# Patient Record
Sex: Male | Born: 1990 | Race: Black or African American | Hispanic: No | Marital: Single | Smoking: Never smoker
Health system: Southern US, Community
[De-identification: ages and names within clinical notes are randomized; demographics above are authoritative.]

## PROBLEM LIST (undated history)

## (undated) DIAGNOSIS — S5290XA Unspecified fracture of unspecified forearm, initial encounter for closed fracture: Secondary | ICD-10-CM

## (undated) DIAGNOSIS — T07XXXA Unspecified multiple injuries, initial encounter: Secondary | ICD-10-CM

## (undated) HISTORY — PX: COSMETIC SURGERY: SHX468

## (undated) HISTORY — PX: INGUINAL HERNIA REPAIR: SHX194

## (undated) SURGERY — OPEN REDUCTION INTERNAL FIXATION (ORIF) DISTAL FEMUR FRACTURE
Anesthesia: Choice | Laterality: Left

---

## 2015-11-25 ENCOUNTER — Emergency Department (HOSPITAL_COMMUNITY)
Admission: EM | Admit: 2015-11-25 | Discharge: 2015-11-25 | Disposition: A | Discharge status: Home / Self Care | Payer: Self-pay | Attending: Emergency Medicine | Admitting: Emergency Medicine

## 2015-11-25 ENCOUNTER — Encounter (HOSPITAL_COMMUNITY): Payer: Self-pay | Admitting: Emergency Medicine

## 2015-11-25 DIAGNOSIS — K047 Periapical abscess without sinus: Secondary | ICD-10-CM | POA: Insufficient documentation

## 2015-11-25 DIAGNOSIS — K0381 Cracked tooth: Secondary | ICD-10-CM | POA: Insufficient documentation

## 2015-11-25 DIAGNOSIS — K029 Dental caries, unspecified: Secondary | ICD-10-CM | POA: Insufficient documentation

## 2015-11-25 MED ORDER — NAPROXEN 250 MG PO TABS
500.0000 mg | ORAL_TABLET | Freq: Once | ORAL | Status: AC
  Administered 2015-11-25: 500 mg via ORAL
  Filled 2015-11-25: qty 2

## 2015-11-25 MED ORDER — NAPROXEN 500 MG PO TABS: 500.0000 mg | ORAL_TABLET | Freq: Two times a day (BID) | ORAL | Status: DC

## 2015-11-25 MED ORDER — PENICILLIN V POTASSIUM 500 MG PO TABS: 500.0000 mg | ORAL_TABLET | Freq: Four times a day (QID) | ORAL | Status: DC

## 2015-11-25 NOTE — ED Provider Notes (Signed)
CSN: [id number]     Arrival date & time 11/25/15  1443 History   First MD Initiated Contact with Patient 11/25/15 1507     Chief Complaint  Patient presents with  . Dental Pain     (Consider location/radiation/quality/duration/timing/severity/associated sxs/prior Treatment) Patient is a 24 y.o. male presenting with tooth pain. The history is provided by the patient and medical records.  Dental Pain    24 year old male here with left lower dental pain for the past 3 days. He states initially with a mild toothache, yesterday he began having swelling of his left cheek. He states is painful to chew on the affected side. No difficulty swallowing or drooling. Denies fever or chills. Patient is a Consulting civil engineerstudent at [employer], he does not currently have a Education officer, communitydentist. No intervention tried prior to arrival.  History reviewed. No pertinent past medical history. History reviewed. No pertinent past surgical history. History reviewed. No pertinent family history. Social History  Substance Use Topics  . Smoking status: Never Smoker   . Smokeless tobacco: None  . Alcohol Use: Yes     Comment: occ    Review of Systems  HENT: Positive for dental problem.   All other systems reviewed and are negative.     Allergies  Review of patient's allergies indicates no known allergies.  Home Medications   Prior to Admission medications   Not on File   BP 112/80 mmHg  Pulse 98  Temp(Src) 98.3 F (36.8 C) (Oral)  Resp 18  SpO2 99%   Physical Exam  Constitutional: He is oriented to person, place, and time. He appears well-developed and well-nourished. No distress.  HENT:  Head: Normocephalic and atraumatic.  Mouth/Throat: Oropharynx is clear and moist.  Teeth largely in very poor dentition, left lower molars broken with dental caries noted, left lower premolar appears infected, surrounding gingiva swollen with abscess noted, no fluctuance or drainage, handling secretions appropriately, no trismus;  mild swelling of left lower cheek without extension into neck'; normal phonation without stridor  Eyes: Conjunctivae and EOM are normal. Pupils are equal, round, and reactive to light.  Neck: Normal range of motion. Neck supple.  Cardiovascular: Normal rate, regular rhythm and normal heart sounds.   Pulmonary/Chest: Effort normal and breath sounds normal. No respiratory distress. He has no wheezes.  Abdominal: Soft. Bowel sounds are normal. There is no tenderness. There is no guarding.  Musculoskeletal: Normal range of motion.  Neurological: He is alert and oriented to person, place, and time.  Skin: Skin is warm. He is not diaphoretic.  Psychiatric: He has a normal mood and affect.  Nursing note and vitals reviewed.   ED Course  Procedures (including critical care time) Labs Review Labs Reviewed - No data to display  Imaging Review No results found. I have personally reviewed and evaluated these images and lab results as part of my medical decision-making.   EKG Interpretation None      MDM   Final diagnoses:  Dental abscess   24 y.o. M here with deental pain.  Signs of abscess noted on exam. No drainable fluid collection at this time.  Mild swelling of left lower cheek without extension into neck. No clinical concern for Ludwig's angina at this time. Patient will be started on antibiotics. He will be given dental follow-up.  Discussed plan with patient, he/she acknowledged understanding and agreed with plan of care.  Return precautions given for new or worsening symptoms.   Garlon HatchetLisa M Huxley Shurley, PA-C 11/25/15 1615  Geoffery Lyonsouglas Delo,  MD 11/25/15 [phone]

## 2015-11-25 NOTE — ED Notes (Signed)
Pt sts left sided dental pain from bad tooth per pt

## 2015-11-25 NOTE — Discharge Instructions (Signed)
Take the prescribed medication as directed. Follow-up with dentist on call--- call their office Monday to make appt. Return to the ED for new or worsening symptoms.  Dental Abscess A dental abscess is a collection of pus in or around a tooth. CAUSES This condition is caused by a bacterial infection around the root of the tooth that involves the inner part of the tooth (pulp). It may result from:  Severe tooth decay.  Trauma to the tooth that allows bacteria to enter into the pulp, such as a broken or chipped tooth.  Severe gum disease around a tooth. SYMPTOMS Symptoms of this condition include:  Severe pain in and around the infected tooth.  Swelling and redness around the infected tooth, in the mouth, or in the face.  Tenderness.  Pus drainage.  Bad breath.  Bitter taste in the mouth.  Difficulty swallowing.  Difficulty opening the mouth.  Nausea.  Vomiting.  Chills.  Swollen neck glands.  Fever. DIAGNOSIS This condition is diagnosed with examination of the infected tooth. During the exam, your dentist may tap on the infected tooth. Your dentist will also ask about your medical and dental history and may order X-rays. TREATMENT This condition is treated by eliminating the infection. This may be done with:  Antibiotic medicine.  A root canal. This may be performed to save the tooth.  Pulling (extracting) the tooth. This may also involve draining the abscess. This is done if the tooth cannot be saved. HOME CARE INSTRUCTIONS  Take medicines only as directed by your dentist.  If you were prescribed antibiotic medicine, finish all of it even if you start to feel better.  Rinse your mouth (gargle) often with salt water to relieve pain or swelling.  Do not drive or operate heavy machinery while taking pain medicine.  Do not apply heat to the outside of your mouth.  Keep all follow-up visits as directed by your dentist. This is important. SEEK MEDICAL CARE  IF:  Your pain is worse and is not helped by medicine. SEEK IMMEDIATE MEDICAL CARE IF:  You have a fever or chills.  Your symptoms suddenly get worse.  You have a very bad headache.  You have problems breathing or swallowing.  You have trouble opening your mouth.  You have swelling in your neck or around your eye.   This information is not intended to replace advice given to you by your health care provider. Make sure you discuss any questions you have with your health care provider.   Document Released: 12/09/2005 Document Revised: 04/25/2015 Document Reviewed: 12/06/2014 Elsevier Interactive Patient Education [employer]2016 Elsevier Inc.

## 2015-11-25 NOTE — ED Notes (Signed)
Declined W/C at D/C and was escorted to lobby by RN. 

## 2015-11-25 NOTE — ED Notes (Signed)
Left facial pain and swelling since last pm.

## 2016-02-21 DIAGNOSIS — S5290XA Unspecified fracture of unspecified forearm, initial encounter for closed fracture: Secondary | ICD-10-CM

## 2016-02-21 HISTORY — DX: Unspecified fracture of unspecified forearm, initial encounter for closed fracture: S52.90XA

## 2016-03-06 ENCOUNTER — Ambulatory Visit (HOSPITAL_COMMUNITY)
Admission: RE | Admit: 2016-03-06 | Discharge: 2016-03-06 | Disposition: A | Discharge status: Home / Self Care | Payer: BLUE CROSS/BLUE SHIELD | Source: Ambulatory Visit | Attending: Orthopedic Surgery | Admitting: Orthopedic Surgery

## 2016-03-06 ENCOUNTER — Other Ambulatory Visit: Payer: Self-pay | Admitting: Orthopedic Surgery

## 2016-03-06 ENCOUNTER — Encounter (HOSPITAL_COMMUNITY): Payer: Self-pay | Admitting: *Deleted

## 2016-03-06 ENCOUNTER — Encounter (HOSPITAL_COMMUNITY)
Admission: RE | Disposition: A | Discharge status: Home / Self Care | Payer: Self-pay | Source: Ambulatory Visit | Attending: Orthopedic Surgery

## 2016-03-06 DIAGNOSIS — Z538 Procedure and treatment not carried out for other reasons: Secondary | ICD-10-CM | POA: Insufficient documentation

## 2016-03-06 DIAGNOSIS — S52502A Unspecified fracture of the lower end of left radius, initial encounter for closed fracture: Secondary | ICD-10-CM | POA: Diagnosis present

## 2016-03-06 DIAGNOSIS — X58XXXA Exposure to other specified factors, initial encounter: Secondary | ICD-10-CM | POA: Insufficient documentation

## 2016-03-06 LAB — CBC
HEMATOCRIT: 42 % (ref 39.0–52.0)
HEMOGLOBIN: 13.8 g/dL (ref 13.0–17.0)
MCH: 30.1 pg (ref 26.0–34.0)
MCHC: 32.9 g/dL (ref 30.0–36.0)
MCV: 91.5 fL (ref 78.0–100.0)
Platelets: 228 10*3/uL (ref 150–400)
RBC: 4.59 MIL/uL (ref 4.22–5.81)
RDW: 13.2 % (ref 11.5–15.5)
WBC: 4.8 10*3/uL (ref 4.0–10.5)

## 2016-03-06 SURGERY — OPEN REDUCTION INTERNAL FIXATION (ORIF) DISTAL RADIUS FRACTURE
Anesthesia: Choice | Laterality: Left

## 2016-03-06 MED ORDER — CHLORHEXIDINE GLUCONATE 4 % EX LIQD: 60.0000 mL | Freq: Once | CUTANEOUS | Status: DC

## 2016-03-06 MED ORDER — CEFAZOLIN SODIUM-DEXTROSE 2-3 GM-% IV SOLR
INTRAVENOUS | Status: AC
  Filled 2016-03-06: qty 50

## 2016-03-06 MED ORDER — CEFAZOLIN SODIUM-DEXTROSE 2-3 GM-% IV SOLR: 2.0000 g | INTRAVENOUS | Status: DC

## 2016-03-06 NOTE — Progress Notes (Addendum)
Received call from Dr. Luiz BlareGraves stating surgery for this patient is to be postponed, d/t late OR time.   I have explained this to patient, who seems adamant about staying.  I explained to patient, that  Dr. Luiz BlareGraves' office will call him again for future instructions.  Have called his ride to come and get him

## 2016-03-06 NOTE — H&P (Signed)
  PREOPERATIVE H&P  Chief Complaint: l distal radius fracture with ulna dislocatuion  HPI: Daniel Livingston is a 24 y.o. male who presents for evaluation of l distal radius fracture with ulnar dislocation. It has been present for 4 days and has been worsening. He has failed conservative measures. Pain is rated as moderate.  No past medical history on file. No past surgical history on file. Social History   Social History  . Marital Status: Single    Spouse Name: N/A  . Number of Children: N/A  . Years of Education: N/A   Social History Main Topics  . Smoking status: Never Smoker   . Smokeless tobacco: Not on file  . Alcohol Use: Yes     Comment: occ  . Drug Use: Yes    Special: Marijuana  . Sexual Activity: Not on file   Other Topics Concern  . Not on file   Social History Narrative  . No narrative on file   No family history on file. No Known Allergies Prior to Admission medications   Medication Sig Start Date End Date Taking? Authorizing Provider  naproxen (NAPROSYN) 500 MG tablet Take 1 tablet (500 mg total) by mouth 2 (two) times daily with a meal. 11/25/15   Lisa M Sanders, PA-C  penicillin v potassium (VEETID) 500 MG tablet Take 1 tablet (500 mg total) by mouth 4 (four) times daily. 11/25/15   Lisa M Sanders, PA-C     Positive ROS: none  All other systems have been reviewed and were otherwise negative with the exception of those mentioned in the HPI and as above.  Physical Exam: There were no vitals filed for this visit.  General: Alert, no acute distress Cardiovascular: No pedal edema Respiratory: No cyanosis, no use of accessory musculature GI: No organomegaly, abdomen is soft and non-tender Skin: No lesions in the area of chief complaint Neurologic: Sensation intact distally Psychiatric: Patient is competent for consent with normal mood and affect Lymphatic: No axillary or cervical lymphadenopathy  MUSCULOSKELETAL: l forearm-sig sts and pain on rom XRAY  reduced ulnar dislocation and short oblique distal radius fracture  Assessment/Plan: Left Forearm Fracture Plan for Procedure(s): OPEN REDUCTION INTERNAL FIXATION (ORIF) DISTAL RADIAL FRACTURE and evaluation of DRUJ  The risks benefits and alternatives were discussed with the patient including but not limited to the risks of nonoperative treatment, versus surgical intervention including infection, bleeding, nerve injury, malunion, nonunion, hardware prominence, hardware failure, need for hardware removal, blood clots, cardiopulmonary complications, morbidity, mortality, among others, and they were willing to proceed.  Predicted outcome is good, although there will be at least a six to nine month expected recovery.  Aron Needles L, MD 03/06/2016 9:36 AM 

## 2016-03-07 ENCOUNTER — Other Ambulatory Visit: Payer: Self-pay | Admitting: Orthopedic Surgery

## 2016-03-07 ENCOUNTER — Encounter (HOSPITAL_BASED_OUTPATIENT_CLINIC_OR_DEPARTMENT_OTHER): Payer: Self-pay | Admitting: *Deleted

## 2016-03-07 DIAGNOSIS — T07XXXA Unspecified multiple injuries, initial encounter: Secondary | ICD-10-CM

## 2016-03-07 HISTORY — DX: Unspecified multiple injuries, initial encounter: T07.XXXA

## 2016-03-08 ENCOUNTER — Ambulatory Visit (HOSPITAL_BASED_OUTPATIENT_CLINIC_OR_DEPARTMENT_OTHER): Payer: BLUE CROSS/BLUE SHIELD | Admitting: Anesthesiology

## 2016-03-08 ENCOUNTER — Ambulatory Visit (HOSPITAL_BASED_OUTPATIENT_CLINIC_OR_DEPARTMENT_OTHER)
Admission: RE | Admit: 2016-03-08 | Discharge: 2016-03-08 | Disposition: A | Discharge status: Home / Self Care | Payer: BLUE CROSS/BLUE SHIELD | Source: Ambulatory Visit | Attending: Orthopedic Surgery | Admitting: Orthopedic Surgery

## 2016-03-08 ENCOUNTER — Encounter (HOSPITAL_BASED_OUTPATIENT_CLINIC_OR_DEPARTMENT_OTHER)
Admission: RE | Disposition: A | Discharge status: Home / Self Care | Payer: Self-pay | Source: Ambulatory Visit | Attending: Orthopedic Surgery

## 2016-03-08 ENCOUNTER — Ambulatory Visit (HOSPITAL_COMMUNITY): Payer: BLUE CROSS/BLUE SHIELD

## 2016-03-08 ENCOUNTER — Encounter (HOSPITAL_BASED_OUTPATIENT_CLINIC_OR_DEPARTMENT_OTHER): Payer: Self-pay | Admitting: Anesthesiology

## 2016-03-08 DIAGNOSIS — S52372A Galeazzi's fracture of left radius, initial encounter for closed fracture: Secondary | ICD-10-CM | POA: Insufficient documentation

## 2016-03-08 DIAGNOSIS — T148XXA Other injury of unspecified body region, initial encounter: Secondary | ICD-10-CM

## 2016-03-08 DIAGNOSIS — X58XXXA Exposure to other specified factors, initial encounter: Secondary | ICD-10-CM | POA: Diagnosis not present

## 2016-03-08 DIAGNOSIS — Z791 Long term (current) use of non-steroidal anti-inflammatories (NSAID): Secondary | ICD-10-CM | POA: Diagnosis not present

## 2016-03-08 HISTORY — DX: Unspecified fracture of unspecified forearm, initial encounter for closed fracture: S52.90XA

## 2016-03-08 HISTORY — PX: OPEN REDUCTION INTERNAL FIXATION (ORIF) DISTAL RADIAL FRACTURE: SHX5989

## 2016-03-08 HISTORY — DX: Unspecified multiple injuries, initial encounter: T07.XXXA

## 2016-03-08 SURGERY — OPEN REDUCTION INTERNAL FIXATION (ORIF) DISTAL RADIUS FRACTURE
Anesthesia: General | Site: Arm Lower | Laterality: Left

## 2016-03-08 MED ORDER — LIDOCAINE HCL (CARDIAC) 20 MG/ML IV SOLN
INTRAVENOUS | Status: AC
Start: 2016-03-08 — End: 2016-03-08
  Filled 2016-03-08: qty 5

## 2016-03-08 MED ORDER — MEPERIDINE HCL 25 MG/ML IJ SOLN: 6.2500 mg | INTRAMUSCULAR | Status: DC | PRN

## 2016-03-08 MED ORDER — FENTANYL CITRATE (PF) 100 MCG/2ML IJ SOLN
INTRAMUSCULAR | Status: AC
  Filled 2016-03-08: qty 2

## 2016-03-08 MED ORDER — LIDOCAINE HCL (CARDIAC) 20 MG/ML IV SOLN
INTRAVENOUS | Status: DC | PRN
  Administered 2016-03-08: 100 mg via INTRAVENOUS

## 2016-03-08 MED ORDER — SCOPOLAMINE 1 MG/3DAYS TD PT72: 1.0000 | MEDICATED_PATCH | Freq: Once | TRANSDERMAL | Status: DC | PRN

## 2016-03-08 MED ORDER — LACTATED RINGERS IV SOLN
INTRAVENOUS | Status: DC
  Administered 2016-03-08 (×2): via INTRAVENOUS

## 2016-03-08 MED ORDER — ONDANSETRON HCL 4 MG/2ML IJ SOLN
INTRAMUSCULAR | Status: AC
  Filled 2016-03-08: qty 2

## 2016-03-08 MED ORDER — CEFAZOLIN SODIUM-DEXTROSE 2-3 GM-% IV SOLR
INTRAVENOUS | Status: AC
  Filled 2016-03-08: qty 50

## 2016-03-08 MED ORDER — FENTANYL CITRATE (PF) 100 MCG/2ML IJ SOLN
50.0000 ug | INTRAMUSCULAR | Status: DC | PRN
  Administered 2016-03-08: 100 ug via INTRAVENOUS

## 2016-03-08 MED ORDER — GLYCOPYRROLATE 0.2 MG/ML IJ SOLN: 0.2000 mg | Freq: Once | INTRAMUSCULAR | Status: DC | PRN

## 2016-03-08 MED ORDER — OXYCODONE-ACETAMINOPHEN 5-325 MG PO TABS: 1.0000 | ORAL_TABLET | ORAL | Status: AC | PRN

## 2016-03-08 MED ORDER — PROPOFOL 10 MG/ML IV BOLUS
INTRAVENOUS | Status: AC
  Filled 2016-03-08: qty 20

## 2016-03-08 MED ORDER — MIDAZOLAM HCL 2 MG/2ML IJ SOLN
1.0000 mg | INTRAMUSCULAR | Status: DC | PRN
  Administered 2016-03-08: 2 mg via INTRAVENOUS

## 2016-03-08 MED ORDER — LACTATED RINGERS IV SOLN: INTRAVENOUS | Status: DC

## 2016-03-08 MED ORDER — CEFAZOLIN SODIUM-DEXTROSE 2-3 GM-% IV SOLR
2.0000 g | INTRAVENOUS | Status: AC
  Administered 2016-03-08: 2 g via INTRAVENOUS

## 2016-03-08 MED ORDER — OXYCODONE HCL 5 MG/5ML PO SOLN: 5.0000 mg | Freq: Once | ORAL | Status: DC | PRN

## 2016-03-08 MED ORDER — MIDAZOLAM HCL 2 MG/2ML IJ SOLN
INTRAMUSCULAR | Status: AC
  Filled 2016-03-08: qty 2

## 2016-03-08 MED ORDER — DEXAMETHASONE SODIUM PHOSPHATE 4 MG/ML IJ SOLN
INTRAMUSCULAR | Status: DC | PRN
  Administered 2016-03-08: 10 mg via INTRAVENOUS

## 2016-03-08 MED ORDER — BUPIVACAINE-EPINEPHRINE (PF) 0.5% -1:200000 IJ SOLN
INTRAMUSCULAR | Status: DC | PRN
  Administered 2016-03-08: 25 mL via PERINEURAL

## 2016-03-08 MED ORDER — PROPOFOL 10 MG/ML IV BOLUS
INTRAVENOUS | Status: DC | PRN
  Administered 2016-03-08: 200 mg via INTRAVENOUS

## 2016-03-08 MED ORDER — HYDROMORPHONE HCL 1 MG/ML IJ SOLN: 0.2500 mg | INTRAMUSCULAR | Status: DC | PRN

## 2016-03-08 MED ORDER — OXYCODONE HCL 5 MG PO TABS: 5.0000 mg | ORAL_TABLET | Freq: Once | ORAL | Status: DC | PRN

## 2016-03-08 MED ORDER — ONDANSETRON HCL 4 MG/2ML IJ SOLN
INTRAMUSCULAR | Status: DC | PRN
  Administered 2016-03-08: 4 mg via INTRAVENOUS

## 2016-03-08 MED ORDER — DEXAMETHASONE SODIUM PHOSPHATE 10 MG/ML IJ SOLN
INTRAMUSCULAR | Status: AC
  Filled 2016-03-08: qty 1

## 2016-03-08 SURGICAL SUPPLY — 66 items
BANDAGE COBAN STERILE 2 (GAUZE/BANDAGES/DRESSINGS) IMPLANT
BIT DRILL 2.5X2.75 QC CALB (BIT) ×3 IMPLANT
BIT DRILL CALIBRATED 2.7 (BIT) ×2 IMPLANT
BIT DRILL CALIBRATED 2.7MM (BIT) ×1
BLADE MINI RND TIP GREEN BEAV (BLADE) IMPLANT
BLADE SURG 15 STRL LF DISP TIS (BLADE) ×1 IMPLANT
BLADE SURG 15 STRL SS (BLADE) ×2
BNDG COHESIVE 4X5 TAN STRL (GAUZE/BANDAGES/DRESSINGS) ×3 IMPLANT
BNDG ESMARK 4X9 LF (GAUZE/BANDAGES/DRESSINGS) ×3 IMPLANT
BNDG GAUZE ELAST 4 BULKY (GAUZE/BANDAGES/DRESSINGS) ×6 IMPLANT
BRUSH SCRUB EZ PLAIN DRY (MISCELLANEOUS) ×3 IMPLANT
CANISTER SUCT 1200ML W/VALVE (MISCELLANEOUS) ×3 IMPLANT
CHLORAPREP W/TINT 26ML (MISCELLANEOUS) ×3 IMPLANT
CORDS BIPOLAR (ELECTRODE) ×3 IMPLANT
COVER BACK TABLE 60X90IN (DRAPES) ×3 IMPLANT
COVER MAYO STAND STRL (DRAPES) ×3 IMPLANT
CUFF TOURNIQUET SINGLE 18IN (TOURNIQUET CUFF) ×3 IMPLANT
CUFF TOURNIQUET SINGLE 24IN (TOURNIQUET CUFF) IMPLANT
DRAPE C-ARM 42X72 X-RAY (DRAPES) ×3 IMPLANT
DRAPE EXTREMITY T 121X128X90 (DRAPE) ×3 IMPLANT
DRAPE SURG 17X23 STRL (DRAPES) ×3 IMPLANT
DRSG ADAPTIC 3X8 NADH LF (GAUZE/BANDAGES/DRESSINGS) ×3 IMPLANT
DRSG EMULSION OIL 3X3 NADH (GAUZE/BANDAGES/DRESSINGS) IMPLANT
ELECT REM PT RETURN 9FT ADLT (ELECTROSURGICAL) ×3
ELECTRODE REM PT RTRN 9FT ADLT (ELECTROSURGICAL) ×1 IMPLANT
GAUZE SPONGE 4X4 12PLY STRL (GAUZE/BANDAGES/DRESSINGS) ×3 IMPLANT
GLOVE BIO SURGEON STRL SZ7.5 (GLOVE) ×3 IMPLANT
GLOVE BIOGEL M STRL SZ7.5 (GLOVE) ×3 IMPLANT
GLOVE BIOGEL PI IND STRL 7.0 (GLOVE) ×2 IMPLANT
GLOVE BIOGEL PI IND STRL 8 (GLOVE) ×1 IMPLANT
GLOVE BIOGEL PI INDICATOR 7.0 (GLOVE) ×4
GLOVE BIOGEL PI INDICATOR 8 (GLOVE) ×2
GLOVE ECLIPSE 6.5 STRL STRAW (GLOVE) ×9 IMPLANT
GOWN STRL REUS W/TWL XL LVL3 (GOWN DISPOSABLE) ×9 IMPLANT
NEEDLE 16GX1 1/2 (NEEDLE) IMPLANT
NEEDLE HYPO 25X1 1.5 SAFETY (NEEDLE) IMPLANT
NS IRRIG 1000ML POUR BTL (IV SOLUTION) ×3 IMPLANT
PACK BASIN DAY SURGERY FS (CUSTOM PROCEDURE TRAY) ×3 IMPLANT
PADDING CAST ABS 4INX4YD NS (CAST SUPPLIES)
PADDING CAST ABS COTTON 4X4 ST (CAST SUPPLIES) IMPLANT
PENCIL BUTTON HOLSTER BLD 10FT (ELECTRODE) ×3 IMPLANT
PLATE LOCK COMP 7H FOOT (Plate) ×3 IMPLANT
RUBBERBAND STERILE (MISCELLANEOUS) IMPLANT
SCREW CORTICAL 3.5MM 14MM (Screw) ×9 IMPLANT
SCREW LOCK CORT STAR 3.5X10 (Screw) ×6 IMPLANT
SCREW LOCK CORT STAR 3.5X12 (Screw) ×6 IMPLANT
SLEEVE SCD COMPRESS KNEE MED (MISCELLANEOUS) ×3 IMPLANT
SLING ARM FOAM STRAP LRG (SOFTGOODS) ×3 IMPLANT
SPLINT FAST PLASTER 5X30 (CAST SUPPLIES) ×2
SPLINT PLASTER CAST FAST 5X30 (CAST SUPPLIES) ×1 IMPLANT
STOCKINETTE 6  STRL (DRAPES) ×2
STOCKINETTE 6 STRL (DRAPES) ×1 IMPLANT
SUCTION FRAZIER HANDLE 10FR (MISCELLANEOUS) ×2
SUCTION TUBE FRAZIER 10FR DISP (MISCELLANEOUS) ×1 IMPLANT
SUT VIC AB 2-0 CT3 27 (SUTURE) ×3 IMPLANT
SUT VIC AB 3-0 PS1 18 (SUTURE) ×2
SUT VIC AB 3-0 PS1 18XBRD (SUTURE) ×1 IMPLANT
SUT VICRYL 4-0 PS2 18IN ABS (SUTURE) IMPLANT
SUT VICRYL RAPIDE 4-0 (SUTURE) IMPLANT
SUT VICRYL RAPIDE 4/0 PS 2 (SUTURE) ×6 IMPLANT
SYR BULB 3OZ (MISCELLANEOUS) ×3 IMPLANT
SYRINGE 10CC LL (SYRINGE) ×3 IMPLANT
TOWEL OR 17X24 6PK STRL BLUE (TOWEL DISPOSABLE) ×3 IMPLANT
TOWEL OR NON WOVEN STRL DISP B (DISPOSABLE) ×3 IMPLANT
TUBING SUCTION 1/4X6FT (MISCELLANEOUS) ×3 IMPLANT
UNDERPAD 30X30 (UNDERPADS AND DIAPERS) ×3 IMPLANT

## 2016-03-08 NOTE — Interval H&P Note (Signed)
History and Physical Interval Note:  03/08/2016 1:22 PM  Daniel Livingston  has presented today for surgery, with the diagnosis of DISPLACED LEFT RADIUS FRACTURE with dislocation at DRUJ S52.372A  The various methods of treatment have been discussed with the patient and family. After consideration of risks, benefits and other options for treatment, the patient has consented to  Procedure(s): OPEN TREATMENT OF LEFT RADIUS FRACTURE (Left) as a surgical intervention .  The patient's history has been reviewed, patient examined, no change in status, stable for surgery.  I have reviewed the patient's chart and labs.  Questions were answered to the patient's satisfaction.     Eutimio Gharibian A.

## 2016-03-08 NOTE — Anesthesia Procedure Notes (Addendum)
Anesthesia Regional Block:  Infraclavicular brachial plexus block  Pre-Anesthetic Checklist: ,, timeout performed, Correct Patient, Correct Site, Correct Laterality, Correct Procedure, Correct Position, site marked, Risks and benefits discussed,  Surgical consent,  Pre-op evaluation,  At surgeon's request and post-op pain management  Laterality: Left and Upper  Prep: chloraprep       Needles:  Injection technique: Single-shot  Needle Type: Echogenic Stimulator Needle     Needle Length: 5cm 5 cm Needle Gauge: 21 and 21 G    Additional Needles:  Procedures: ultrasound guided (picture in chart) Infraclavicular brachial plexus block Narrative:  Start time: 03/08/2016 1:28 PM End time: 03/08/2016 1:34 PM Injection made incrementally with aspirations every 5 mL.  Performed by: Personally  Anesthesiologist: CREWS, DAVID   Procedure Name: LMA Insertion Date/Time: 03/08/2016 1:44 PM Performed by: York Livingston, Daniel Olberding W Pre-anesthesia Checklist: Patient identified, Emergency Drugs available, Suction available and Patient being monitored Patient Re-evaluated:Patient Re-evaluated prior to inductionOxygen Delivery Method: Circle System Utilized Preoxygenation: Pre-oxygenation with 100% oxygen Intubation Type: IV induction Ventilation: Mask ventilation without difficulty LMA: LMA inserted LMA Size: 4.0 Number of attempts: 1 Airway Equipment and Method: Bite block Placement Confirmation: positive ETCO2 and breath sounds checked- equal and bilateral Tube secured with: Tape Dental Injury: Teeth and Oropharynx as per pre-operative assessment       Left Infraclavicular block image

## 2016-03-08 NOTE — Transfer of Care (Signed)
Immediate Anesthesia Transfer of Care Note  Patient: Daniel Livingston  Procedure(s) Performed: Procedure(s): OPEN TREATMENT OF LEFT RADIUS FRACTURE (Left)  Patient Location: PACU  Anesthesia Type:General and GA combined with regional for post-op pain  Level of Consciousness: awake and alert   Airway & Oxygen Therapy: Patient Spontanous Breathing and Patient connected to face mask oxygen  Post-op Assessment: Report given to RN and Post -op Vital signs reviewed and stable  Post vital signs: Reviewed and stable  Last Vitals:  Filed Vitals:   03/08/16 1330 03/08/16 1331  BP: 128/86   Pulse: 70 62  Temp:    Resp: 12 13    Complications: No apparent anesthesia complications

## 2016-03-08 NOTE — Discharge Instructions (Signed)
Discharge Instructions   You have a dressing with a plaster splint incorporated in it. Move your fingers as much as possible, making a full fist and fully opening the fist. Elevate your hand to reduce pain & swelling of the digits.  Ice over the operative site may be helpful to reduce pain & swelling.  DO NOT USE HEAT. Pain medicine has been prescribed for you.  Use your medicine as needed over the first 48 hours, and then you can begin to taper your use.  You may use Tylenol in place of your prescribed pain medication, but not IN ADDITION to it. Leave the dressing in place until you return to our office.  You may shower, but keep the bandage clean & dry.  You may drive a car when you are off of prescription pain medications and can safely control your vehicle with both hands. Call our office to make an appointment for 10-to 15 days from the date of surgery.   Please call [phone] during normal business hours or [phone] after hours for any problems. Including the following:  - excessive redness of the incisions - drainage for more than 4 days - fever of more than 101.5 F  *Please note that pain medications will not be refilled after hours or on weekends.  Post Anesthesia Home Care Instructions  Activity: Get plenty of rest for the remainder of the day. A responsible adult should stay with you for 24 hours following the procedure.  For the next 24 hours, DO NOT: -Drive a car -Advertising copywriterperate machinery -Drink alcoholic beverages -Take any medication unless instructed by your physician -Make any legal decisions or sign important papers.  Meals: Start with liquid foods such as gelatin or soup. Progress to regular foods as tolerated. Avoid greasy, spicy, heavy foods. If nausea and/or vomiting occur, drink only clear liquids until the nausea and/or vomiting subsides. Call your physician if vomiting continues.  Special Instructions/Symptoms: Your throat may feel dry or sore from the  anesthesia or the breathing tube placed in your throat during surgery. If this causes discomfort, gargle with warm salt water. The discomfort should disappear within 24 hours.  If you had a scopolamine patch placed behind your ear for the management of post- operative nausea and/or vomiting:  1. The medication in the patch is effective for 72 hours, after which it should be removed.  Wrap patch in a tissue and discard in the trash. Wash hands thoroughly with soap and water. 2. You may remove the patch earlier than 72 hours if you experience unpleasant side effects which may include dry mouth, dizziness or visual disturbances. 3. Avoid touching the patch. Wash your hands with soap and water after contact with the patch.   Regional Anesthesia Blocks  1. Numbness or the inability to move the "blocked" extremity may last from 3-48 hours after placement. The length of time depends on the medication injected and your individual response to the medication. If the numbness is not going away after 48 hours, call your surgeon.  2. The extremity that is blocked will need to be protected until the numbness is gone and the  Strength has returned. Because you cannot feel it, you will need to take extra care to avoid injury. Because it may be weak, you may have difficulty moving it or using it. You may not know what position it is in without looking at it while the block is in effect.  3. For blocks in the legs and feet, returning to  weight bearing and walking needs to be done carefully. You will need to wait until the numbness is entirely gone and the strength has returned. You should be able to move your leg and foot normally before you try and bear weight or walk. You will need someone to be with you when you first try to ensure you do not fall and possibly risk injury.  4. Bruising and tenderness at the needle site are common side effects and will resolve in a few days.  5. Persistent numbness or new problems  with movement should be communicated to the surgeon or the [facility] [phone] [facility] [phone]).

## 2016-03-08 NOTE — Anesthesia Postprocedure Evaluation (Signed)
Anesthesia Post Note  Patient: Daniel Livingston  Procedure(s) Performed: Procedure(s) (LRB): OPEN TREATMENT OF LEFT RADIUS FRACTURE (Left)  Patient location during evaluation: PACU Anesthesia Type: General Level of consciousness: awake and alert Pain management: pain level controlled Vital Signs Assessment: post-procedure vital signs reviewed and stable Respiratory status: spontaneous breathing, nonlabored ventilation and respiratory function stable Cardiovascular status: blood pressure returned to baseline and stable Postop Assessment: no signs of nausea or vomiting Anesthetic complications: no    Last Vitals:  Filed Vitals:   03/08/16 1515 03/08/16 1530  BP: 133/75 130/87  Pulse: 78 78  Temp:    Resp: 12 10    Last Pain:  Filed Vitals:   03/08/16 1534  PainSc: 0-No pain                 Analisa Sledd A

## 2016-03-08 NOTE — H&P (View-Only) (Signed)
  PREOPERATIVE H&P  Chief Complaint: l distal radius fracture with ulna dislocatuion  HPI: Daniel Livingston is a 25 y.o. male who presents for evaluation of l distal radius fracture with ulnar dislocation. It has been present for 4 days and has been worsening. He has failed conservative measures. Pain is rated as moderate.  No past medical history on file. No past surgical history on file. Social History   Social History  . Marital Status: Single    Spouse Name: N/A  . Number of Children: N/A  . Years of Education: N/A   Social History Main Topics  . Smoking status: Never Smoker   . Smokeless tobacco: Not on file  . Alcohol Use: Yes     Comment: occ  . Drug Use: Yes    Special: Marijuana  . Sexual Activity: Not on file   Other Topics Concern  . Not on file   Social History Narrative  . No narrative on file   No family history on file. No Known Allergies Prior to Admission medications   Medication Sig Start Date End Date Taking? Authorizing Provider  naproxen (NAPROSYN) 500 MG tablet Take 1 tablet (500 mg total) by mouth 2 (two) times daily with a meal. 11/25/15   Daniel HatchetLisa M Sanders, PA-C  penicillin v potassium (VEETID) 500 MG tablet Take 1 tablet (500 mg total) by mouth 4 (four) times daily. 11/25/15   Daniel HatchetLisa M Sanders, PA-C     Positive ROS: none  All other systems have been reviewed and were otherwise negative with the exception of those mentioned in the HPI and as above.  Physical Exam: There were no vitals filed for this visit.  General: Alert, no acute distress Cardiovascular: No pedal edema Respiratory: No cyanosis, no use of accessory musculature GI: No organomegaly, abdomen is soft and non-tender Skin: No lesions in the area of chief complaint Neurologic: Sensation intact distally Psychiatric: Patient is competent for consent with normal mood and affect Lymphatic: No axillary or cervical lymphadenopathy  MUSCULOSKELETAL: l forearm-sig sts and pain on rom XRAY  reduced ulnar dislocation and short oblique distal radius fracture  Assessment/Plan: Left Forearm Fracture Plan for Procedure(s): OPEN REDUCTION INTERNAL FIXATION (ORIF) DISTAL RADIAL FRACTURE and evaluation of DRUJ  The risks benefits and alternatives were discussed with the patient including but not limited to the risks of nonoperative treatment, versus surgical intervention including infection, bleeding, nerve injury, malunion, nonunion, hardware prominence, hardware failure, need for hardware removal, blood clots, cardiopulmonary complications, morbidity, mortality, among others, and they were willing to proceed.  Predicted outcome is good, although there will be at least a six to nine month expected recovery.  Daniel Livingston L, MD 03/06/2016 9:36 AM

## 2016-03-08 NOTE — Progress Notes (Signed)
Assisted Dr. Crews with left, ultrasound guided, infraclavicular block. Side rails up, monitors on throughout procedure. See vital signs in flow sheet. Tolerated Procedure well. 

## 2016-03-08 NOTE — Op Note (Signed)
03/08/2016  1:23 PM  PATIENT:  Daniel Livingston  25 y.o. male  PRE-OPERATIVE DIAGNOSIS:  Left Galeazzi Fracture of Radius with DRUJ injury  POST-OPERATIVE DIAGNOSIS:  Same  PROCEDURE:  ORIF left Galeazzi fx of radius with closed treatment of DRUJ disruption  SURGEON: Cliffton Astersavid A. Janee Mornhompson, MD  PHYSICIAN ASSISTANT: Danielle RankinKirsten Schrader, OPA-C  ANESTHESIA:  regional and general  SPECIMENS:  None  DRAINS: None  EBL:  less than 50 mL  PREOPERATIVE INDICATIONS:  Daniel FinesShamon Surles is a  25 y.o. male with a h/o left forearm Galeazzi fracture/Dx at DRUJ, provisionally reduced, and originally to undergo operative tx with Dr. Luiz BlareGraves.  Instead, the patient arrived at place of surgery having just eaten.  Surgery was re-scheduled to today, and Dr. Luiz BlareGraves asked me to assume care.  Today, I reviewed with the patient the goals, risks, and options and general operative plan and obtained consent.  The risks benefits and alternatives were discussed with the patient preoperatively including but not limited to the risks of infection, bleeding, nerve injury, cardiopulmonary complications, the need for revision surgery, among others, and the patient verbalized understanding and consented to proceed.  OPERATIVE IMPLANTS: Biomet small fragment 3.5 mm 7-hole plate/screws  OPERATIVE PROCEDURE: After receiving prophylactic antibiotics and a regional block, the patient was escorted to the operative theatre and placed in a supine position.  General anesthesia was administered.  A surgical "time-out" was performed during which the planned procedure, proposed operative site, and the correct patient identity were compared to the operative consent and agreement confirmed by the circulating nurse according to current facility policy. Following application of a tourniquet to the operative extremity, the exposed skin was pre-scrub with Hibiclens scrub brush and then was prepped with Chloraprep and draped in the usual sterile fashion. The  limb was exsanguinated with an Esmarch bandage and the tourniquet inflated to approximately 100mmHg higher than systolic BP.   A sinusoidal-shaped incision was marked and made over the FCR axis at the mid-forearm. The skin was incised sharply with scalpel, subcutaneous tissues with blunt and spreading dissection. The FCR axis was exploited deeply.  Hand raise interval was exploited.  The fracture was cleaned of debris and provisionally reduced.  A 7 hole locking plate was selected.  It was slightly contoured to match the both the radius, and it was placed in its provisional alignment of the radius and this was confirmed fluoroscopically.  It was secured to the radius with a screw through the slotted hole on the distal side of the fracture.  The other 2 distal locking screws were then placed.  The proximal slotted hole was then filled with a compressing nonlocking screw, providing excellent reduction and compression at the fracture site.  The other 2 proximal locking holes were drilled and filled.   Final images of the fracture fixation were obtained and the DRUJ was examined for stability.  It was found to be nicely reduced, and the decision was made to proceed with nonoperative management for the distal radial ulnar joint.  The wound is copiously irrigated, the tourniquet released, and the skin reapproximated with 3-0 Vicryl deep dermal buried sutures and a running 4-0 Vicryl Rapide subcuticular suture followed by benzoin and Steri-Strips.  A long-arm splint dressing was applied with fiberglass splint component, placing the forearm in supination.  He was awakened and taken to recovery in stable condition, breathing spontaneously.  DISPOSITION: The patient will be discharged home today with typical post-op instructions, returning in 10-15 days for reevaluation with new x-rays  of the left forearm out of the splint and conversion to a long-arm cast for an additional 2 weeks.

## 2016-03-08 NOTE — Anesthesia Preprocedure Evaluation (Signed)
Anesthesia Evaluation  Patient identified by MRN, date of birth, ID band Patient awake    Reviewed: Allergy & Precautions, NPO status , Patient's Chart, lab work & pertinent test results  Airway Mallampati: I  TM Distance: >3 FB Neck ROM: Full    Dental  (+) Teeth Intact, Dental Advisory Given   Pulmonary    breath sounds clear to auscultation       Cardiovascular  Rhythm:Regular Rate:Normal     Neuro/Psych    GI/Hepatic   Endo/Other    Renal/GU      Musculoskeletal   Abdominal   Peds  Hematology   Anesthesia Other Findings   Reproductive/Obstetrics                             Anesthesia Physical Anesthesia Plan  ASA: I  Anesthesia Plan: General   Post-op Pain Management: GA combined w/ Regional for post-op pain   Induction: Intravenous  Airway Management Planned: LMA  Additional Equipment:   Intra-op Plan:   Post-operative Plan: Extubation in OR  Informed Consent: I have reviewed the patients History and Physical, chart, labs and discussed the procedure including the risks, benefits and alternatives for the proposed anesthesia with the patient or authorized representative who has indicated his/her understanding and acceptance.   Dental advisory given  Plan Discussed with: CRNA, Anesthesiologist and Surgeon  Anesthesia Plan Comments:         Anesthesia Quick Evaluation  

## 2016-03-09 NOTE — Anesthesia Postprocedure Evaluation (Signed)
Anesthesia Post Note  Patient: Daniel Livingston  Procedure(s) Performed: Procedure(s) (LRB): OPEN TREATMENT OF LEFT RADIUS FRACTURE (Left)  Anesthesia Post Evaluation  Last Vitals:  Filed Vitals:   03/08/16 1600 03/08/16 1610  BP: 137/97 148/96  Pulse: 87 68  Temp:  36.8 C  Resp: 25 18    Last Pain:  Filed Vitals:   03/08/16 1613  PainSc: 0-No pain                 Chlora Mcbain A

## 2016-03-11 ENCOUNTER — Encounter (HOSPITAL_BASED_OUTPATIENT_CLINIC_OR_DEPARTMENT_OTHER): Payer: Self-pay | Admitting: Orthopedic Surgery

## 2016-03-13 ENCOUNTER — Encounter (HOSPITAL_BASED_OUTPATIENT_CLINIC_OR_DEPARTMENT_OTHER): Payer: Self-pay | Admitting: Orthopedic Surgery

## 2017-07-14 ENCOUNTER — Emergency Department (HOSPITAL_COMMUNITY)
Admission: EM | Admit: 2017-07-14 | Discharge: 2017-07-14 | Disposition: A | Discharge status: Home / Self Care | Payer: BLUE CROSS/BLUE SHIELD | Attending: Physician Assistant | Admitting: Physician Assistant

## 2017-07-14 ENCOUNTER — Encounter (HOSPITAL_COMMUNITY): Payer: Self-pay | Admitting: *Deleted

## 2017-07-14 DIAGNOSIS — K0889 Other specified disorders of teeth and supporting structures: Secondary | ICD-10-CM | POA: Insufficient documentation

## 2017-07-14 NOTE — ED Notes (Signed)
Patient Alert and oriented X4. Stable and ambulatory. Patient verbalized understanding of the discharge instructions.  Patient belongings were taken by the patient.  

## 2017-07-14 NOTE — Discharge Instructions (Signed)
You were evaluated in the emergency department for dental pain. On exam there were no signs of infection or abscess to warrant antibiotics at this time. You to have multiple cracked teeth and exposed dentin which can cause tooth hypersensitivity and pain and also predispose you to dental abscesses. Please contact dentist as soon as possible for reevaluation and possible tooth extractions. Monitor your symptoms, return to the ED if develop fevers, facial swelling, draining or bleeding from gums or gum abscess.

## 2017-07-14 NOTE — ED Triage Notes (Signed)
Pt states that he has had rt upper dental pain for one week. Pt states that he has a broken tooth there.

## 2017-07-14 NOTE — ED Provider Notes (Signed)
[facility]-EMERGENCY DEPT Provider Note   CSN: [id number] Arrival date & time: 07/14/17  1240   By signing my name below, I, [name], attest that this documentation has been prepared under the direction and in the presence of Sharen Hecklaudia Gibbons, PA-C Electronically Signed: [name], ED Scribe. 07/14/17. 5:33 PM.  History   Chief Complaint Chief Complaint  Patient presents with  . Dental Pain    HPI Daniel Livingston is a 26 y.o. male who presents to the Emergency Department complaining of right upper dental pain onset 2 days ago. Pt reports associated but now resolved facial swelling. Pt has not tried any medications for the relief of his symptoms. Pt reports that he has multiple fractured teeth to the right upper area and is worried that he may have a dental abscess at this time. He rates his right upper dental pain as a 5-6/10 at its worst and 1/10 at the present moment. He notes that he has had a dental abscess to his left sided mouth several years ago. Denies having a dentist at this time, but notes that when he last had a dental extraction at [facility]. Denies drainage, fever, chills, and any other symptoms.   The history is provided by the patient. No language interpreter was used.    Past Medical History:  Diagnosis Date  . Abrasions of multiple sites 03/07/2016   right arm  . Fracture of radius 02/2016   left; displaced    There are no active problems to display for this patient.   Past Surgical History:  Procedure Laterality Date  . COSMETIC SURGERY Left    hand  . INGUINAL HERNIA REPAIR Bilateral   . OPEN REDUCTION INTERNAL FIXATION (ORIF) DISTAL RADIAL FRACTURE Left 03/08/2016   Procedure: OPEN TREATMENT OF LEFT RADIUS FRACTURE;  Surgeon: Mack Hookavid Thompson, MD;  Location: [facility];  Service: Orthopedics;  Laterality: Left;       Home Medications    Prior to Admission medications   Medication Sig Start Date End Date Taking? Authorizing  Provider  oxyCODONE-acetaminophen (PERCOCET/ROXICET) 5-325 MG tablet Take 1-2 tablets by mouth every 4 (four) hours as needed for severe pain. 03/08/16   Mack Hookhompson, David, MD    Family History No family history on file.  Social History Social History  Substance Use Topics  . Smoking status: Never Smoker  . Smokeless tobacco: Never Used  . Alcohol use Yes     Comment: occasionally     Allergies   Patient has no known allergies.   Review of Systems Review of Systems  Constitutional: Negative for chills and fever.  HENT: Positive for dental problem (right upper) and facial swelling (resolved right sided).        No drainage from the affected area     Physical Exam Updated Vital Signs BP (!) 128/93 (BP Location: Left Arm)   Pulse 75   Temp 99.2 F (37.3 C) (Oral)   Resp 18   Ht 6\' 4"  (1.93 m)   Wt 160 lb (72.6 kg)   SpO2 99%   BMI 19.48 kg/m   Physical Exam  Constitutional: He is oriented to person, place, and time. He appears well-developed and well-nourished. No distress.  NAD.  HENT:  Head: Normocephalic and atraumatic.  Right Ear: External ear normal.  Left Ear: External ear normal.  Nose: Nose normal.  Mouth/Throat: Abnormal dentition.  Mild tenderness around gingival border of tooth #3 without edema, erythema, fluctuance, or signs of abscess.  Tooth #1-3  are cracked, #3 with dentin exposed Poor dentition.  No facial or anterior neck edema, erythema or erythema. No sublingual edema or tenderness.  Soft palate flat without tenderness.  No trismus.   No pooling of oral secretions.  Phonation normal, no hot potato voice.  Maxilla and mandible nontender. Mastoids without edema, erythema or tenderness.    Eyes: Conjunctivae and EOM are normal. No scleral icterus.  Neck: Normal range of motion. Neck supple.  Cardiovascular: Normal rate, regular rhythm, normal heart sounds and intact distal pulses.   No murmur heard. Pulmonary/Chest: Effort normal and breath  sounds normal. He has no wheezes.  Musculoskeletal: Normal range of motion. He exhibits no deformity.  Neurological: He is alert and oriented to person, place, and time.  Skin: Skin is warm and dry. Capillary refill takes less than 2 seconds.  Psychiatric: He has a normal mood and affect. His behavior is normal. Judgment and thought content normal.  Nursing note and vitals reviewed.    ED Treatments / Results  DIAGNOSTIC STUDIES: Oxygen Saturation is 99% on RA, nl by my interpretation.    COORDINATION OF CARE: 3:14 PM Discussed treatment plan with pt at bedside and pt agreed to plan.   Labs (all labs ordered are listed, but only abnormal results are displayed) Labs Reviewed - No data to display  EKG  EKG Interpretation None       Radiology No results found.  Procedures Procedures (including critical care time)  Medications Ordered in ED Medications - No data to display   Initial Impression / Assessment and Plan / ED Course  I have reviewed the triage vital signs and the nursing notes.  Pertinent labs & imaging results that were available during my care of the patient were reviewed by me and considered in my medical decision making (see chart for details).     Patient with dentalgia.  No abscess requiring immediate incision and drainage.  No signs of infectious process along cracked tooth, dentin is exposed and likely causing hypersensitivity. Antibiotics not indicated at this time. Exam not concerning for Ludwig's angina or pharyngeal abscess.  Will treat with symptomatic treatment. Pt instructed to follow-up with dentist.  Discussed return precautions. Pt safe for discharge. Patient is aware that he is at risk for dental infection secondary to poor dentition, he is aware of symptoms that would warrant return to the ED for reevaluation.  Final Clinical Impressions(s) / ED Diagnoses   Final diagnoses:  Pain, dental    New Prescriptions Discharge Medication List as  of 07/14/2017  3:22 PM     I personally performed the services described in this documentation, which was scribed in my presence. The recorded information has been reviewed and is accurate.    Liberty Handy, PA-C 07/14/17 1733    Abelino Derrick, MD 07/14/17 2027

## 2017-09-25 IMAGING — RF DG WRIST 2V*L*
1 series · 3 of 3 positions shown · non-contrast
Comparison: None.

CLINICAL DATA: ORIF left radius fracture

EXAM:
LEFT WRIST - 2 VIEW; DG C-ARM 61-120 MIN

[Series 1: run · 3 of 3 slices shown]
[im 1/3]
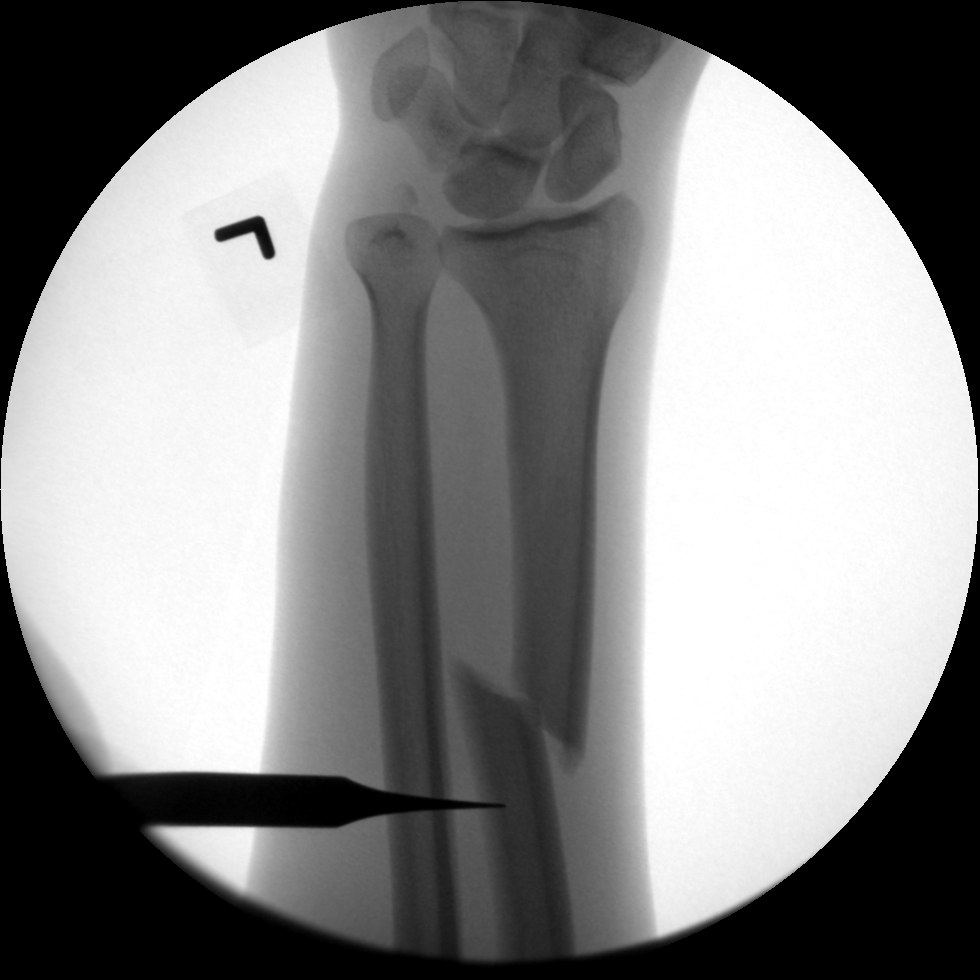
[im 2/3]
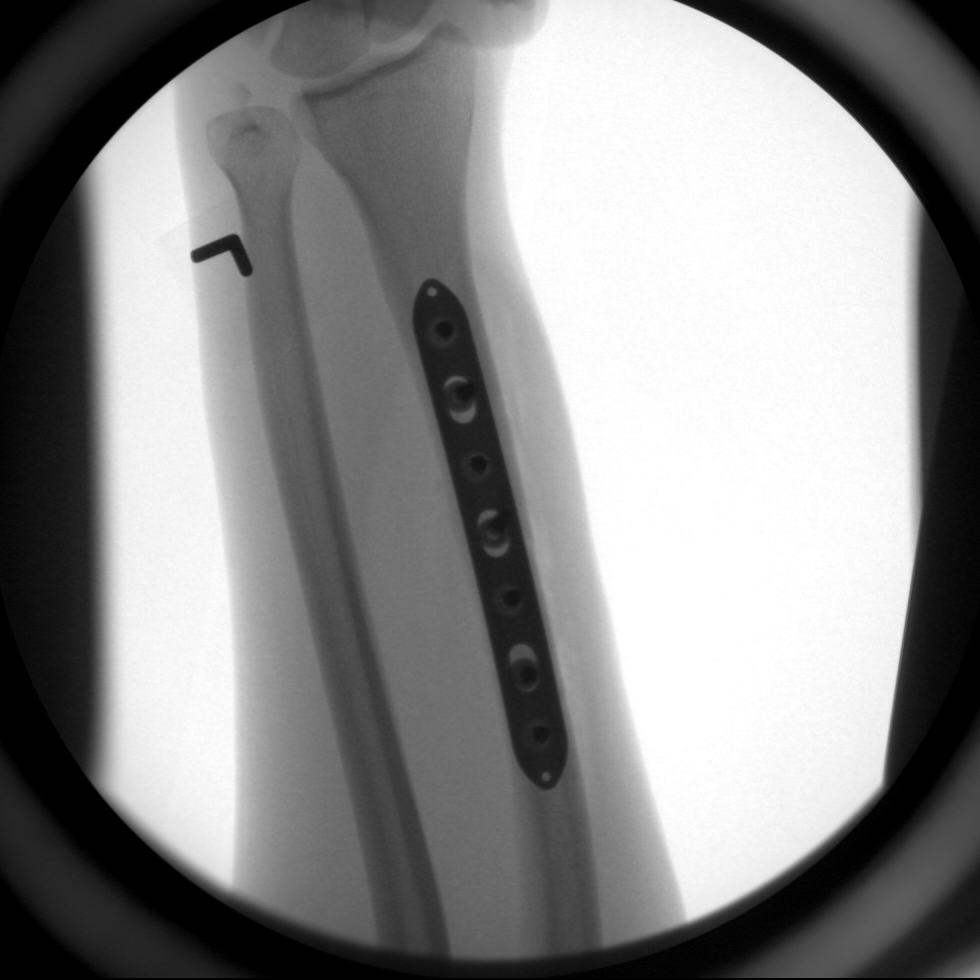
[im 3/3]
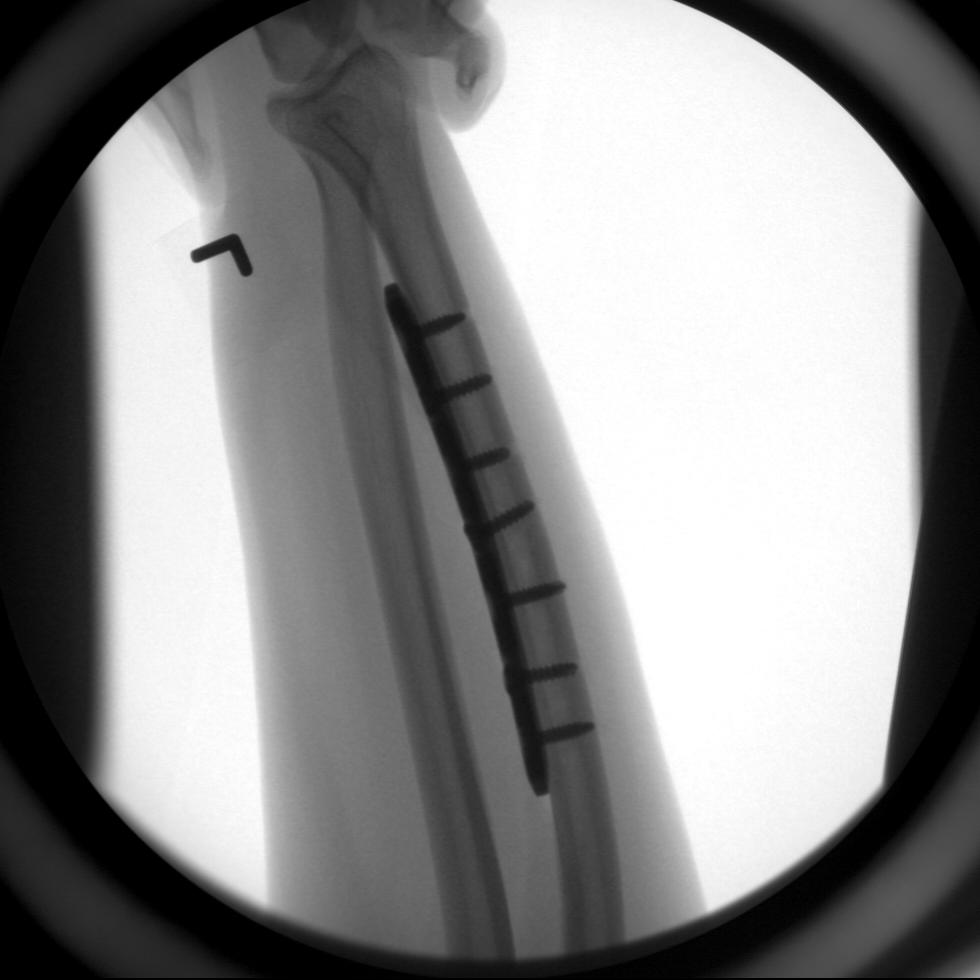

[3 of 3 positions shown; findings below may reference images not displayed]

FINDINGS: Three intraoperative spot images demonstrate plate and screw
fixation across the midshaft left radial fracture. Final images
demonstrate anatomic alignment. No hardware complicating feature.
Mildly displaced ulnar styloid fracture noted.
IMPRESSION: Internal fixation across the midshaft left radial fracture. Anatomic
alignment.
